# Patient Record
Sex: Female | Born: 1945 | Race: Black or African American | Hispanic: No | Marital: Married | Smoking: Never smoker
Health system: Southern US, Community
[De-identification: ages and names within clinical notes are randomized; demographics above are authoritative.]

## PROBLEM LIST (undated history)

## (undated) DIAGNOSIS — I10 Essential (primary) hypertension: Secondary | ICD-10-CM

## (undated) HISTORY — PX: ABDOMINAL HYSTERECTOMY: SHX81

## (undated) HISTORY — PX: TONSILLECTOMY: SUR1361

## (undated) HISTORY — PX: URETHRAL CYST REMOVAL: SHX5128

## (undated) HISTORY — PX: BREAST SURGERY: SHX581

---

## 2008-08-16 ENCOUNTER — Ambulatory Visit: Payer: Self-pay | Admitting: Family Medicine

## 2008-08-16 DIAGNOSIS — I10 Essential (primary) hypertension: Secondary | ICD-10-CM | POA: Insufficient documentation

## 2010-01-10 ENCOUNTER — Ambulatory Visit: Payer: Self-pay | Admitting: Emergency Medicine

## 2010-01-10 DIAGNOSIS — J069 Acute upper respiratory infection, unspecified: Secondary | ICD-10-CM | POA: Insufficient documentation

## 2010-07-08 NOTE — Assessment & Plan Note (Signed)
Summary: ALLERGIES/KH   Vital Signs:  Patient Profile:   65 Years Old Female CC:      sinus pressure, productive cough, hoarsness x 5 days Height:     64.5 inches Weight:      165 pounds O2 Sat:      100 % O2 treatment:    Room Air Temp:     98.0 degrees F oral Pulse rate:   95 / minute Resp:     14 per minute BP sitting:   133 / 79  (right arm) Cuff size:   regular  Pt. in pain?   no  Vitals Entered By: Lajean Saver RN (January 10, 2010 10:28 AM)                   Current Allergies (reviewed today): ! DARVOCET ! * TYLENOL #3History of Present Illness Chief Complaint: sinus pressure, productive cough, hoarsness x 5 days History of Present Illness: Sinus pressure, cough, hoarseness for 5 days.  She has this about once a year and attributes it to alergies.  It usualy occurs in the spring.  She has been fatigued, facial pain, feeling warm.  No OTCs help.    REVIEW OF SYSTEMS Constitutional Symptoms      Denies fever, chills, night sweats, weight loss, weight gain, and fatigue.  Eyes       Denies change in vision, eye pain, eye discharge, glasses, contact lenses, and eye surgery. Ear/Nose/Throat/Mouth       Complains of sinus problems and hoarseness.      Denies hearing loss/aids, change in hearing, ear pain, ear discharge, dizziness, frequent runny nose, frequent nose bleeds, sore throat, and tooth pain or bleeding.  Respiratory       Complains of wheezing and shortness of breath.      Denies dry cough, productive cough, asthma, bronchitis, and emphysema/COPD.  Cardiovascular       Denies murmurs, chest pain, and tires easily with exhertion.    Gastrointestinal       Denies stomach pain, nausea/vomiting, diarrhea, constipation, blood in bowel movements, and indigestion. Genitourniary       Denies painful urination, kidney stones, and loss of urinary control. Neurological       Denies paralysis, seizures, and fainting/blackouts. Musculoskeletal       Denies muscle  pain, joint pain, joint stiffness, decreased range of motion, redness, swelling, muscle weakness, and gout.  Skin       Denies bruising, unusual mles/lumps or sores, and hair/skin or nail changes.  Psych       Denies mood changes, temper/anger issues, anxiety/stress, speech problems, depression, and sleep problems. Other Comments: symptoms began 5 days ago   Past History:  Past Medical History: Reviewed history from 08/16/2008 and no changes required. HTN dx 2009 Hypertension  Past Surgical History: Reviewed history from 08/16/2008 and no changes required. Rt breast cyst removed age 40 Cystectomy urethra age 9 Hysteroscopy 2002  Family History: Reviewed history from 08/16/2008 and no changes required. Mother deceased Father deceased Brothers age  39,52, 17,55 healthy sisters ages 88 colon ca 3 Breast CA 50 alive and healthy  Social History: Never smoked occ ETOH Drug use-no Drug Use:  no Physical Exam General appearance: well developed, well nourished, no acute distress Ears: normal, no lesions or deformities Nasal: clear discharge Oral/Pharynx: clean PND Neck: neck supple,  trachea midline, no masses Chest/Lungs: no rales, wheezes, or rhonchi bilateral, breath sounds equal without effort Heart: regular rate and  rhythm, no  murmur Skin: no obvious rashes or lesions Assessment New Problems: UPPER RESPIRATORY INFECTION (ICD-465.9)   Patient Education: Patient and/or caregiver instructed in the following: Tylenol prn.  Plan New Medications/Changes: RONDEC-DM 1 tsp by mouth Q6 hours as needed for cough  #150cc x 0, 01/10/2010, Hoyt Koch MD ZITHROMAX Z-PAK 250 MG  TABS (AZITHROMYCIN) Use as directed  #1 x 0, 01/10/2010, Hoyt Koch MD  New Orders: Est. Patient Level II [phone] Planning Comments:   Hydration Take a daily antihistamine (Claritin, etc) Follow-up with your primary care physician if not improving   The patient and/or caregiver  has been counseled thoroughly with regard to medications prescribed including dosage, schedule, interactions, rationale for use, and possible side effects and they verbalize understanding.  Diagnoses and expected course of recovery discussed and will return if not improved as expected or if the condition worsens. Patient and/or caregiver verbalized understanding.  Prescriptions: RONDEC-DM 1 tsp by mouth Q6 hours as needed for cough  #150cc x 0   Entered and Authorized by:   Hoyt Koch MD   Signed by:   Hoyt Koch MD on 01/10/2010   Method used:   Print then Give to Patient   RxID:   [id number] ZITHROMAX Z-PAK 250 MG  TABS (AZITHROMYCIN) Use as directed  #1 x 0   Entered and Authorized by:   Hoyt Koch MD   Signed by:   Hoyt Koch MD on 01/10/2010   Method used:   Print then Give to Patient   RxID:   [id number]   Orders Added: 1)  Est. Patient Level II [29528]

## 2011-05-07 ENCOUNTER — Encounter: Payer: Self-pay | Admitting: *Deleted

## 2011-05-07 ENCOUNTER — Emergency Department (INDEPENDENT_AMBULATORY_CARE_PROVIDER_SITE_OTHER)
Admission: EM | Admit: 2011-05-07 | Discharge: 2011-05-07 | Disposition: A | Discharge status: Home / Self Care | Payer: Medicare Other | Source: Home / Self Care | Attending: Family Medicine | Admitting: Family Medicine

## 2011-05-07 DIAGNOSIS — J069 Acute upper respiratory infection, unspecified: Secondary | ICD-10-CM

## 2011-05-07 MED ORDER — BENZONATATE 200 MG PO CAPS: 200.0000 mg | ORAL_CAPSULE | Freq: Every day | ORAL | Status: AC

## 2011-05-07 MED ORDER — AZITHROMYCIN 250 MG PO TABS: ORAL_TABLET | ORAL | Status: AC

## 2011-05-07 NOTE — ED Notes (Signed)
Pt c/o nasal congestion, drainage and sinus pressure x 1 1/2 wk. She also c/o productive cough. She has taken Alka seltzer cold. Denies fever.

## 2011-05-07 NOTE — ED Provider Notes (Signed)
History     CSN: [id number] Arrival date & time: 05/07/2011 11:25 AM   First MD Initiated Contact with Patient 05/07/11 1144      Chief Complaint  Patient presents with  . Nasal Congestion  . Cough     HPI Comments: Patient complains of approximately 10 day history of gradually progressive URI symptoms beginning with a mild sore throat (now improved), followed by progressive nasal congestion.  A cough started about 43days ago.  Complains of fatigue but no myalgias.  Cough is now worse at night and generally productive during the day.  There has been no pleuritic pain, shortness of breath, or wheezes.   Patient is a 65 y.o. female presenting with cough. The history is provided by the patient.  Cough This is a new problem. The current episode started more than 2 days ago. The problem occurs hourly. The problem has been gradually worsening. The cough is productive of sputum. Associated symptoms include headaches, rhinorrhea, sore throat and wheezing. Pertinent negatives include no chest pain, no chills, no sweats, no weight loss, no ear congestion, no ear pain, no myalgias, no shortness of breath and no eye redness. She has tried decongestants for the symptoms. The treatment provided mild relief. She is not a smoker. Her past medical history does not include pneumonia.    History reviewed. No pertinent past medical history.  Past Surgical History  Procedure Date  . Breast surgery     LT cyst removal age 53  . Tonsillectomy   . Urethral cyst removal     Family History  Problem Relation Age of Onset  . Breast cancer Sister   . Colon cancer Sister     History  Substance Use Topics  . Smoking status: Never Smoker   . Smokeless tobacco: Not on file  . Alcohol Use: Yes    OB History    Grav Para Term Preterm Abortions TAB SAB Ect Mult Living                  Review of Systems  Constitutional: Positive for fatigue. Negative for fever, chills and weight loss.  HENT: Positive  for congestion, sore throat, rhinorrhea and postnasal drip. Negative for ear pain, facial swelling and ear discharge.   Eyes: Negative.  Negative for redness.  Respiratory: Positive for cough and wheezing. Negative for shortness of breath.   Cardiovascular: Negative for chest pain.  Gastrointestinal: Negative.   Genitourinary: Negative.   Musculoskeletal: Negative.  Negative for myalgias.  Neurological: Positive for headaches.    Allergies  Propoxyphene n-acetaminophen; Shellfish allergy; and Tylenol-codeine  Home Medications   Current Outpatient Rx  Name Route Sig Dispense Refill  . AZITHROMYCIN 250 MG PO TABS  Take 2 tabs today; then begin one tab once daily for 4 more days (Rx void after 05/15/11)   DEA # [id number] each 0  . BENZONATATE 200 MG PO CAPS Oral Take 1 capsule (200 mg total) by mouth at bedtime. Take as needed for cough 12 capsule 0    BP 127/84  Pulse 95  Temp(Src) 98.5 F (36.9 C) (Oral)  Resp 18  Ht 5\' 4"  (1.626 m)  Wt 164 lb 8 oz (74.617 kg)  BMI 28.24 kg/m2  SpO2 99%  Physical Exam Nursing notes and Vital Signs reviewed. Appearance:  Patient appears healthy, stated age, and in no acute distress  Eyes:  Pupils are equal, round, and reactive to light and accomodation.  Extraocular movement is intact.  Conjunctivae are not  inflamed  Ears:  Canals normal.  Tympanic membranes normal.  Nose:  Mildly congested turbinates.  No sinus tenderness.   Pharynx:  Normal  Neck:  Supple.  Slightly tender shotty posterior nodes are palpated bilaterally   Lungs:  Clear to auscultation.  Breath sounds are equal.  Heart:  Regular rate and rhythm without murmurs, rubs, or gallops.  Abdomen:  Nontender without masses or hepatosplenomegaly.  Bowel sounds are present.  No CVA or flank tenderness.  Extremities:  No edema or tenderness Skin:  No rash present  ED Course  Procedures None      1. Acute upper respiratory infections of unspecified site       MDM  There  is no evidence of bacterial infection today.  Suspect viral URI Treat symptomatically for now:  Increase fluid intake, begin expectorant/decongestant, topical decongestant, saline nasal spray/saline irrigation, cough suppressant at bedtime. If fever/chills persist, or if not improving 5 days begin amoxicillin (given Rx to hold).  Followup with PCP if not improving 7 to 10 days.        Donna Christen, MD 05/07/11 [phone]

## 2011-05-12 ENCOUNTER — Telehealth: Payer: Self-pay | Admitting: Emergency Medicine

## 2011-07-01 DIAGNOSIS — Z124 Encounter for screening for malignant neoplasm of cervix: Secondary | ICD-10-CM | POA: Diagnosis not present

## 2011-07-01 DIAGNOSIS — Z01419 Encounter for gynecological examination (general) (routine) without abnormal findings: Secondary | ICD-10-CM | POA: Diagnosis not present

## 2011-07-21 DIAGNOSIS — Z803 Family history of malignant neoplasm of breast: Secondary | ICD-10-CM | POA: Diagnosis not present

## 2011-07-21 DIAGNOSIS — Z1231 Encounter for screening mammogram for malignant neoplasm of breast: Secondary | ICD-10-CM | POA: Diagnosis not present

## 2011-07-21 DIAGNOSIS — M949 Disorder of cartilage, unspecified: Secondary | ICD-10-CM | POA: Diagnosis not present

## 2011-07-21 DIAGNOSIS — M899 Disorder of bone, unspecified: Secondary | ICD-10-CM | POA: Diagnosis not present

## 2012-06-21 ENCOUNTER — Emergency Department (INDEPENDENT_AMBULATORY_CARE_PROVIDER_SITE_OTHER)
Admission: EM | Admit: 2012-06-21 | Discharge: 2012-06-21 | Disposition: A | Discharge status: Home / Self Care | Payer: Medicare Other | Source: Home / Self Care | Attending: Family Medicine | Admitting: Family Medicine

## 2012-06-21 ENCOUNTER — Encounter: Payer: Self-pay | Admitting: *Deleted

## 2012-06-21 DIAGNOSIS — J069 Acute upper respiratory infection, unspecified: Secondary | ICD-10-CM

## 2012-06-21 DIAGNOSIS — R5383 Other fatigue: Secondary | ICD-10-CM | POA: Diagnosis not present

## 2012-06-21 DIAGNOSIS — R5381 Other malaise: Secondary | ICD-10-CM | POA: Diagnosis not present

## 2012-06-21 LAB — POCT CBC W AUTO DIFF (K'VILLE URGENT CARE)

## 2012-06-21 MED ORDER — AZITHROMYCIN 250 MG PO TABS: ORAL_TABLET | ORAL | Status: DC

## 2012-06-21 NOTE — ED Notes (Signed)
Pt c/o productive cough, nasal congestion, fatigue, and RT ear ache x 10 days. Denies fever.

## 2012-06-21 NOTE — ED Provider Notes (Signed)
History     CSN: [id number]  Arrival date & time 06/21/12  1436   First MD Initiated Contact with Patient 06/21/12 1449      Chief Complaint  Patient presents with  . Nasal Congestion  . Cough      HPI Comments: Patient complains of onset of fatigue, myalgias, and chills/sweats about 10 days ago.  No other symptoms developed until about 5 or 6 days ago when she developed a cough and sinus congestion.  The cough is generally productive and worse at night.  She remains fatigued,and has noted mild congestion in her right ear.  The history is provided by the patient.    History reviewed. No pertinent past medical history.  Past Surgical History  Procedure Date  . Breast surgery     LT cyst removal age 67  . Tonsillectomy   . Urethral cyst removal   . Abdominal hysterectomy     Family History  Problem Relation Age of Onset  . Breast cancer Sister   . Colon cancer Sister     History  Substance Use Topics  . Smoking status: Never Smoker   . Smokeless tobacco: Not on file  . Alcohol Use: Yes    OB History    Grav Para Term Preterm Abortions TAB SAB Ect Mult Living                  Review of Systems No sore throat + cough No pleuritic pain No wheezing + nasal congestion + post-nasal drainage ? sinus pain/pressure No itchy/red eyes ? earache No hemoptysis No SOB No fever, + chills/sweats No nausea No vomiting No abdominal pain No diarrhea No urinary symptoms No skin rashes + fatigue + myalgias No headache Used OTC meds without relief  Allergies  Acetaminophen-codeine; Propoxyphene-acetaminophen; and Shellfish allergy  Home Medications   Current Outpatient Rx  Name  Route  Sig  Dispense  Refill  . CALCIUM + D PO   Oral   Take by mouth.         . OMEGA-3 FATTY ACIDS 1000 MG PO CAPS   Oral   Take 2 g by mouth daily.         . MULTIPLE VITAMIN PO   Oral   Take by mouth.         . AZITHROMYCIN 250 MG PO TABS      Take 2 tabs today;  then begin one tab once daily for 4 more days. (Rx void after 06/30/12)   6 each   0     BP 167/85  Pulse 88  Temp 98 F (36.7 C) (Oral)  Resp 18  Ht 5\' 4"  (1.626 m)  Wt 163 lb (73.936 kg)  BMI 27.98 kg/m2  SpO2 100%  Physical Exam Nursing notes and Vital Signs reviewed. Appearance:  Patient appears healthy, stated age, and in no acute distress Eyes:  Pupils are equal, round, and reactive to light and accomodation.  Extraocular movement is intact.  Conjunctivae are not inflamed  Ears:  Canals normal.  Tympanic membranes normal.  Nose:  Mildly congested turbinates, moreso on right.  Mild maxillary sinus tenderness is present.  Pharynx:  Normal Neck:  Supple.  No adenopathy Lungs:  Clear to auscultation.  Breath sounds are equal.  Heart:  Regular rate and rhythm without murmurs, rubs, or gallops.  Abdomen:  Nontender without masses or hepatosplenomegaly.  Bowel sounds are present.  No CVA or flank tenderness.  Extremities:  No edema.  No  calf tenderness Skin:  No rash present.   ED Course  Procedures none   Labs Reviewed  POCT CBC W AUTO DIFF ([facility])  WBC 6.9; LY 38.4; MO 5.8; GR 55.8; Hgb 12.4; Platelets 252       1. Fatigue, probably secondary to present illness   2. Acute upper respiratory infections of unspecified site; suspect viral URI       MDM  There is no evidence of bacterial infection today.   Treat symptomatically for now  Take plain Mucinex (guaifenesin) twice daily for cough and congestion.  May add Sudafed for nasal congestion.  Increase fluid intake, rest. May use Afrin nasal spray (or generic oxymetazoline) twice daily for about 5 days.  Also recommend using saline nasal spray several times daily and saline nasal irrigation (AYR is a common brand) May take Delsym Cough Suppressant at bedtime for nighttime cough.  Stop all antihistamines for now, and other non-prescription cough/cold preparations. Begin Azithromycin if not improving about  5 days or if persistent fever develops. Follow-up with family doctor if not improving 7 to 10 days.        Lattie Haw, MD 06/22/12 1102

## 2012-10-05 DIAGNOSIS — Z124 Encounter for screening for malignant neoplasm of cervix: Secondary | ICD-10-CM | POA: Diagnosis not present

## 2012-10-05 DIAGNOSIS — Z1212 Encounter for screening for malignant neoplasm of rectum: Secondary | ICD-10-CM | POA: Diagnosis not present

## 2012-10-05 DIAGNOSIS — Z01419 Encounter for gynecological examination (general) (routine) without abnormal findings: Secondary | ICD-10-CM | POA: Diagnosis not present

## 2012-10-05 DIAGNOSIS — N94819 Vulvodynia, unspecified: Secondary | ICD-10-CM | POA: Diagnosis not present

## 2012-12-07 DIAGNOSIS — H43399 Other vitreous opacities, unspecified eye: Secondary | ICD-10-CM | POA: Diagnosis not present

## 2012-12-07 DIAGNOSIS — H524 Presbyopia: Secondary | ICD-10-CM | POA: Diagnosis not present

## 2012-12-07 DIAGNOSIS — H40009 Preglaucoma, unspecified, unspecified eye: Secondary | ICD-10-CM | POA: Diagnosis not present

## 2013-06-07 DIAGNOSIS — N9089 Other specified noninflammatory disorders of vulva and perineum: Secondary | ICD-10-CM | POA: Diagnosis not present

## 2014-03-08 DIAGNOSIS — Z01419 Encounter for gynecological examination (general) (routine) without abnormal findings: Secondary | ICD-10-CM | POA: Diagnosis not present

## 2014-03-08 DIAGNOSIS — N814 Uterovaginal prolapse, unspecified: Secondary | ICD-10-CM | POA: Diagnosis not present

## 2014-09-20 DIAGNOSIS — H25011 Cortical age-related cataract, right eye: Secondary | ICD-10-CM | POA: Diagnosis not present

## 2014-09-20 DIAGNOSIS — H40003 Preglaucoma, unspecified, bilateral: Secondary | ICD-10-CM | POA: Diagnosis not present

## 2015-05-23 DIAGNOSIS — Z01419 Encounter for gynecological examination (general) (routine) without abnormal findings: Secondary | ICD-10-CM | POA: Diagnosis not present

## 2015-10-30 DIAGNOSIS — B079 Viral wart, unspecified: Secondary | ICD-10-CM | POA: Diagnosis not present

## 2015-10-30 DIAGNOSIS — D485 Neoplasm of uncertain behavior of skin: Secondary | ICD-10-CM | POA: Diagnosis not present

## 2015-10-30 DIAGNOSIS — L821 Other seborrheic keratosis: Secondary | ICD-10-CM | POA: Diagnosis not present

## 2015-11-20 DIAGNOSIS — B079 Viral wart, unspecified: Secondary | ICD-10-CM | POA: Diagnosis not present

## 2016-06-04 ENCOUNTER — Emergency Department (INDEPENDENT_AMBULATORY_CARE_PROVIDER_SITE_OTHER)
Admission: EM | Admit: 2016-06-04 | Discharge: 2016-06-04 | Disposition: A | Discharge status: Home / Self Care | Payer: Medicare Other | Source: Home / Self Care | Attending: Family Medicine | Admitting: Family Medicine

## 2016-06-04 ENCOUNTER — Encounter: Payer: Self-pay | Admitting: *Deleted

## 2016-06-04 ENCOUNTER — Emergency Department (INDEPENDENT_AMBULATORY_CARE_PROVIDER_SITE_OTHER): Payer: Medicare Other

## 2016-06-04 DIAGNOSIS — M779 Enthesopathy, unspecified: Secondary | ICD-10-CM | POA: Diagnosis not present

## 2016-06-04 DIAGNOSIS — M25542 Pain in joints of left hand: Secondary | ICD-10-CM | POA: Diagnosis not present

## 2016-06-04 DIAGNOSIS — M79642 Pain in left hand: Secondary | ICD-10-CM | POA: Diagnosis not present

## 2016-06-04 DIAGNOSIS — M778 Other enthesopathies, not elsewhere classified: Secondary | ICD-10-CM

## 2016-06-04 HISTORY — DX: Essential (primary) hypertension: I10

## 2016-06-04 MED ORDER — DICLOFENAC SODIUM 1 % TD GEL: 1.0000 "application " | Freq: Three times a day (TID) | TRANSDERMAL | 0 refills | Status: DC

## 2016-06-04 NOTE — ED Provider Notes (Signed)
[name]    CSN: [id number] Arrival date & time: 06/04/16  1113     History   Chief Complaint Chief Complaint  Patient presents with  . Hand Pain    HPI Darlene Strickland is a 70 y.o. female.   Patient fell down stairs about one month ago, injuring her left hand.  She complains of persistent soreness and stiffness in her left hand each evening after work.   The history is provided by the patient.  Hand Pain  This is a new problem. Episode onset: 1 month ago. The problem occurs constantly. The problem has not changed since onset.Exacerbated by: flexion and extension of fingers. Nothing relieves the symptoms. She has tried nothing for the symptoms.    Past Medical History:  Diagnosis Date  . Hypertension     Patient Active Problem List   Diagnosis Date Noted  . UPPER RESPIRATORY INFECTION 01/10/2010  . HYPERTENSION 08/16/2008    Past Surgical History:  Procedure Laterality Date  . ABDOMINAL HYSTERECTOMY    . BREAST SURGERY     LT cyst removal age 58  . TONSILLECTOMY    . URETHRAL CYST REMOVAL      OB History    No data available       Home Medications    Prior to Admission medications   Medication Sig Start Date End Date Taking? Authorizing Provider  Calcium Carbonate-Vitamin D (CALCIUM + D PO) Take by mouth.    Historical Provider, MD  diclofenac sodium (VOLTAREN) 1 % GEL Apply 1 application topically 3 (three) times daily. Apply 1 to 2 gm each application [id number]   Kandra Nicolas, MD  fish oil-omega-3 fatty acids 1000 MG capsule Take 2 g by mouth daily.    Historical Provider, MD  MULTIPLE VITAMIN PO Take by mouth.    Historical Provider, MD    Family History Family History  Problem Relation Age of Onset  . Breast cancer Sister   . Colon cancer Sister     Social History Social History  Substance Use Topics  . Smoking status: Never Smoker  . Smokeless tobacco: Never Used  . Alcohol use Yes     Allergies     Acetaminophen-codeine; Darvon [propoxyphene]; Propoxyphene n-acetaminophen; and Shellfish allergy   Review of Systems Review of Systems  All other systems reviewed and are negative.    Physical Exam Triage Vital Signs ED Triage Vitals  Enc Vitals Group     BP 06/04/16 1138 168/92     Pulse Rate 06/04/16 1138 77     Resp 06/04/16 1138 18     Temp 06/04/16 1138 97.5 F (36.4 C)     Temp Source 06/04/16 1138 Oral     SpO2 06/04/16 1138 100 %     Weight 06/04/16 1139 166 lb (75.3 kg)     Height 06/04/16 1139 5\' 4"  (1.626 m)     Head Circumference --      Peak Flow --      Pain Score 06/04/16 1140 6     Pain Loc --      Pain Edu? --      Excl. in [place]? --    No data found.   Updated Vital Signs BP 168/92 (BP Location: Left Arm)   Pulse 77   Temp 97.5 F (36.4 C) (Oral)   Resp 18   Ht 5\' 4"  (1.626 m)   Wt 166 lb (75.3 kg)   SpO2 100%   BMI 28.49 kg/m  Visual Acuity Right Eye Distance:   Left Eye Distance:   Bilateral Distance:    Right Eye Near:   Left Eye Near:    Bilateral Near:     Physical Exam  Constitutional: She appears well-developed and well-nourished. No distress.  HENT:  Head: Normocephalic.  Right Ear: External ear normal.  Left Ear: External ear normal.  Nose: Nose normal.  Mouth/Throat: Oropharynx is clear and moist.  Eyes: Conjunctivae are normal. Pupils are equal, round, and reactive to light.  Neck: Normal range of motion.  Cardiovascular: Normal heart sounds.   Pulmonary/Chest: Breath sounds normal.  Musculoskeletal:       Left hand: She exhibits decreased range of motion and tenderness. She exhibits no bony tenderness.       Hands: Left hand reveals distinct tenderness to palpation over the palmar surfaces of the 2nd, 3rd, and 4th MCP and PIP joints.  Pain is elicited by restricted flexion of these joints.  Distal neurovascular function is intact.   Neurological: She is alert.  Skin: Skin is warm and dry.  Nursing note and vitals  reviewed.    UC Treatments / Results  Labs (all labs ordered are listed, but only abnormal results are displayed) Labs Reviewed - No data to display  EKG  EKG Interpretation None       Radiology Dg Hand Complete Left  Result Date: 06/04/2016 CLINICAL DATA:  Left hand pain across the first and third metacarpals after fall 1 month ago. Initial encounter. EXAM: LEFT HAND - COMPLETE 3+ VIEW COMPARISON:  None. FINDINGS: There is no evidence of fracture or dislocation. No opaque foreign body. Osteopenia and mild degenerative productive change around the MCP and interphalangeal joints. IMPRESSION: No acute or posttraumatic finding. Electronically Signed   By: Monte Fantasia M.D.   On: 06/04/2016 11:46    Procedures Procedures (including critical care time)  Medications Ordered in UC Medications - No data to display   Initial Impression / Assessment and Plan / UC Course  I have reviewed the triage vital signs and the nursing notes.  Pertinent labs & imaging results that were available during my care of the patient were reviewed by me and considered in my medical decision making (see chart for details).  Clinical Course   Suspect tendonitis of finger flexor tendons. Begin Voltaren gel. Followup with Dr. Aundria Mems or Dr. Lynne Leader ([facility]) for further evaluation/treatment.     Final Clinical Impressions(s) / UC Diagnoses   Final diagnoses:  Tendonitis of finger    New Prescriptions New Prescriptions   DICLOFENAC SODIUM (VOLTAREN) 1 % GEL    Apply 1 application topically 3 (three) times daily. Apply 1 to 2 gm each application     Kandra Nicolas, MD 06/21/16 1640

## 2016-06-04 NOTE — ED Triage Notes (Signed)
Pt c/o LT hand pain x 1 mth post fall.

## 2016-06-09 ENCOUNTER — Ambulatory Visit (INDEPENDENT_AMBULATORY_CARE_PROVIDER_SITE_OTHER): Payer: Medicare Other

## 2016-06-09 ENCOUNTER — Encounter: Payer: Self-pay | Admitting: Sports Medicine

## 2016-06-09 ENCOUNTER — Ambulatory Visit (INDEPENDENT_AMBULATORY_CARE_PROVIDER_SITE_OTHER): Payer: Medicare Other | Admitting: Sports Medicine

## 2016-06-09 DIAGNOSIS — M25561 Pain in right knee: Secondary | ICD-10-CM | POA: Diagnosis not present

## 2016-06-09 DIAGNOSIS — I10 Essential (primary) hypertension: Secondary | ICD-10-CM

## 2016-06-09 DIAGNOSIS — M170 Bilateral primary osteoarthritis of knee: Secondary | ICD-10-CM

## 2016-06-09 DIAGNOSIS — M25562 Pain in left knee: Secondary | ICD-10-CM | POA: Diagnosis not present

## 2016-06-09 DIAGNOSIS — M19042 Primary osteoarthritis, left hand: Secondary | ICD-10-CM

## 2016-06-09 MED ORDER — MELOXICAM 15 MG PO TABS: ORAL_TABLET | ORAL | 3 refills | Status: DC

## 2016-06-09 NOTE — Assessment & Plan Note (Signed)
Pain locally and focused at the third MCP after a fall. No fractures, this is likely just osteoarthritis. She is fairly swollen here. Meloxicam for one month, hand therapy, injection if no better.

## 2016-06-09 NOTE — Assessment & Plan Note (Signed)
Meloxicam and physical therapy for the hands and the knees. The x-rays.  Return in one month, we will consider viscosupplementation if no better.

## 2016-06-09 NOTE — Progress Notes (Signed)
   Subjective:    I'm seeing this patient as a consultation for:  Dr. Theone Murdoch  CC: Left hand pain  HPI: This is a very pleasant and athletic 71 year old female, over things giving she tripped and fell onto her left hand, she had immediate pain, swelling, and a bit of bruising at the third MCP. She continued to use her hand, and presented to urgent care last week. X-rays were negative with the exception of widespread osteoarthritis, and she was referred to me for further evaluation and definitive treatment, pain is persistent, localized without radiation.  In addition she complains of bilateral knee pain with gelling, anterior, worse with going up and down stairs, moderate, persistent without radiation, no mechanical symptoms.  Past medical history:  Negative.  See flowsheet/record as well for more information.  Surgical history: Negative.  See flowsheet/record as well for more information.  Family history: Negative.  See flowsheet/record as well for more information.  Social history: Negative.  See flowsheet/record as well for more information.  Allergies, and medications have been entered into the medical record, reviewed, and no changes needed.   Review of Systems: No headache, visual changes, nausea, vomiting, diarrhea, constipation, dizziness, abdominal pain, skin rash, fevers, chills, night sweats, weight loss, swollen lymph nodes, body aches, joint swelling, muscle aches, chest pain, shortness of breath, mood changes, visual or auditory hallucinations.   Objective:   General: Well Developed, well nourished, and in no acute distress.  Neuro/Psych: Alert and oriented x3, extra-ocular muscles intact, able to move all 4 extremities, sensation grossly intact. Skin: Warm and dry, no rashes noted.  Respiratory: Not using accessory muscles, speaking in full sentences, trachea midline.  Cardiovascular: Pulses palpable, no extremity edema. Abdomen: Does not appear distended. Left hand:  Visibly swollen over the third MCP, no bruising, somewhat stiff, with loss of range of motion in flexion at the MCP.  Impression and Recommendations:   This case required medical decision making of moderate complexity.  Primary osteoarthritis of both knees Meloxicam and physical therapy for the hands and the knees. The x-rays.  Return in one month, we will consider viscosupplementation if no better.  Osteoarthritis of hand, primary localized, left Pain locally and focused at the third MCP after a fall. No fractures, this is likely just osteoarthritis. She is fairly swollen here. Meloxicam for one month, hand therapy, injection if no better.

## 2016-06-17 ENCOUNTER — Ambulatory Visit (INDEPENDENT_AMBULATORY_CARE_PROVIDER_SITE_OTHER): Payer: Medicare Other | Admitting: Rehabilitative and Restorative Service Providers"

## 2016-06-17 ENCOUNTER — Encounter: Payer: Self-pay | Admitting: Rehabilitative and Restorative Service Providers"

## 2016-06-17 DIAGNOSIS — M79642 Pain in left hand: Secondary | ICD-10-CM | POA: Diagnosis not present

## 2016-06-17 DIAGNOSIS — R29898 Other symptoms and signs involving the musculoskeletal system: Secondary | ICD-10-CM

## 2016-06-17 NOTE — Therapy (Signed)
[facility] [place], [place], [address] Phone: [phone]   Fax:  [phone]  Physical Therapy Evaluation  Patient Details  Name: Darlene Strickland MRN: [id number] Date of Birth: 01/21/1946 Referring Provider: Dr Dianah Field  Encounter Date: 06/17/2016      PT End of Session - 06/17/16 1137    Visit Number 1   Number of Visits 6   Date for PT Re-Evaluation 07/29/16   PT Start Time 1016   PT Stop Time 1109   PT Time Calculation (min) 53 min   Activity Tolerance Patient tolerated treatment well      Past Medical History:  Diagnosis Date  . Hypertension     Past Surgical History:  Procedure Laterality Date  . ABDOMINAL HYSTERECTOMY    . BREAST SURGERY     LT cyst removal age 71  . TONSILLECTOMY    . URETHRAL CYST REMOVAL      There were no vitals filed for this visit.       Subjective Assessment - 06/17/16 1029    Subjective Patient reports thta she fell 11/17 when going down the stairs. She fell landing on the Lt hand. she noticed soreness in the hand which has continued. She was seen by MD and diagnosed with osteoarthritic changes. Patient reports pain with functional activities and any use of Lt hand and stiffness in the hand in the morning when she first awakens.    Pertinent History arthris; bilat CTS - received injections and had PT for CTS with good resolution no problems in past 10 years    Diagnostic tests xrays - osteoarthritis    Patient Stated Goals get hand feeling better- use hand normally   Currently in Pain? Yes   Pain Score 6    Pain Location Hand   Pain Orientation Left   Pain Descriptors / Indicators Burning;Aching   Pain Type Acute pain   Pain Onset More than a month ago   Pain Frequency Intermittent   Aggravating Factors  moving/using hand; touching hand in areas of discomfort    Pain Relieving Factors OTC meds; analgesic cream; mosit heat             [facility] PT  Assessment - 06/17/16 0001      Assessment   Medical Diagnosis Lt hand pain    Referring Provider Dr Dianah Field   Onset Date/Surgical Date 05/03/16   Hand Dominance Right   Next MD Visit 2/18   Prior Therapy none for hand      Precautions   Precautions None     Balance Screen   Has the patient fallen in the past 6 months Yes   How many times? 1   Has the patient had a decrease in activity level because of a fear of falling?  No   Is the patient reluctant to leave their home because of a fear of falling?  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    Leisure household chores/activities - active      Observation/Other Assessments   Focus on Therapeutic Outcomes (FOTO)  50% limitation      Sensation   Additional Comments intermittent tingling in tips of  fingers      Posture/Postural Control   Posture Comments some head forward posture; shoulders rounded and elevated; increased thoracic kyphosis     AROM   Right/Left Shoulder --  WFL's and equal bilat    Right/Left Elbow --  WFL's and equal bilat    Right/Left Forearm --  WFL's and equal bilat    Right/Left Wrist --  WFL's and equal bilat    Right/Left Finger --  WFL's MP/PIP/DIP - tight composite Lt long finger      Strength   Overall Strength Comments WFL's with manual muscle testing bilat UE's patient has some pain with resisted isoloated Lt long finger extension      Palpation   Palpation comment tenderness to palpation Lt extensor forearm compared to Rt                    [facility] Adult PT Treatment/Exercise - 06/17/16 0001      Therapeutic Activites    Therapeutic Activities --  discussed self massage through the Lt extensor forearm      Hand  Exercises   MCPJ Flexion AAROM;Left  long finger 10-15 sec hold x 3 reps    Other Hand Exercises extensor forearm stretch elbow straight; wrist flexed; light fist - assisting with Rt UE for stretch 10-15 sec hold x 3   Other Hand Exercises nerve/facial stretch pt supine head rotated and flexed to Rt; Lt shd at 90 deg abd; elbow extended; wrist flexed; palm toward floor; light fist 1 min hold x 2      Electrical Stimulation   Electrical Stimulation Location Lt long finger and wrist dorsal surfaces; Lt extensor forearm    Electrical Stimulation Action IFC   Electrical Stimulation Parameters to tolerance   Electrical Stimulation Goals Pain;Tone     Manual Therapy   Manual therapy comments pt sitting Lt UE supported    Soft tissue mobilization working through the Lt long finger and wrist dorsally into the Lt extensor forearm   Passive ROM Lt long finger stretcing multiple joints into composit fist with wrist flexion to pt tolerance 10-15 sec hold x 3 reps                 PT Education - 06/17/16 1111    Education provided Yes   Education Details HEP; TENS    Person(s) Educated Patient   Methods Explanation;Demonstration;Tactile cues;Verbal cues;Handout   Comprehension Verbalized understanding;Returned demonstration;Verbal cues required;Tactile cues required             PT Long Term Goals - 06/17/16 1142      PT LONG TERM GOAL #1   Title Patient to demonstrate full painfree composit fisting of Lt fingers 07/29/16   Time 6   Period Weeks   Status New     PT LONG TERM GOAL #2   Title Patient to report return to normal functional activities including household chores without pain or limitations 07/29/16   Time 6   Period Weeks   Status New     PT LONG TERM GOAL #3   Title Independent in HEP 07/29/16   Time 6   Period Weeks   Status New     PT LONG TERM GOAL #4   Title Improve FOTO to </= 34% limitation 07/29/16   Time 6   Period Weeks   Status New                Plan - 06/17/16 1138  Clinical Impression Statement Patient presents with Lt hand and speciffically Lt long finger pain following a fall 05/03/16. She has pain with resistive Lt long finger extension; tightness to palpation through Lt extensor forearm; pain and limitations with functional activities using Lt hand. Patient has irritation through Lt long finger extensors.    Rehab Potential Good   PT Frequency 1x / week   PT Duration 6 weeks   PT Treatment/Interventions Patient/family education;ADLs/Self Care Home Management;Cryotherapy;Electrical Stimulation;Iontophoresis 4mg /ml Dexamethasone;Moist Heat;Ultrasound;Dry needling;Manual techniques;Therapeutic activities;Therapeutic exercise   PT Next Visit Plan assess grip strength; continue stretching Lt wrist and finger extensors, speciffically Lt long finger; manual work through extensors; modalities as indicated; consider ionto as indicated    Consulted and Agree with Plan of Care Patient      Patient will benefit from skilled therapeutic intervention in order to improve the following deficits and impairments:  Pain, Impaired UE functional use, Increased fascial restricitons, Increased muscle spasms, Decreased activity tolerance  Visit Diagnosis: Pain of left hand - Plan: PT plan of care cert/re-cert  Other symptoms and signs involving the musculoskeletal system - Plan: PT plan of care cert/re-cert     Problem List Patient Active Problem List   Diagnosis Date Noted  . Osteoarthritis of hand, primary localized, left 06/09/2016  . Primary osteoarthritis of both knees 06/09/2016  . Essential hypertension 08/16/2008    Alto Gandolfo Nilda Simmer PT, MPH  06/17/2016, 11:47 AM  [facility] [place], [place], [address] Phone: [phone]   Fax:  [phone]  Name: Danyale Mayall MRN: [id number] Date of Birth: 08/03/1945

## 2016-06-17 NOTE — Patient Instructions (Addendum)
Wrist Extensor Stretch    Keeping elbow straight, light fist, grasp left hand and slowly bend wrist forward until stretch is felt. Hold __20__ seconds. Relax. Repeat __3__ times per set. Do __3-4 __ sessions per day.    Neurovascular:Nerve Stretch - Supine    Lie with neck supported, side-bent away from moving arm. Hold right arm out to side, elbow bent, palm facing the floor. Hold for _60___ seconds. Repeat _2_ times. Do __2 times/day  PROM: Finger MP Joints    Passively bend __long______ finger(s) of left hand at first row of knuckles until stretch is felt, then bend the next joint and the next joint.  Hold _15-20___ seconds. Relax.  Repeat __3-5__ times per set.  Do __2-3__ sessions per day.  TENS UNIT: This is helpful for muscle pain and spasm.   Search and Purchase a TENS 7000 2nd edition at www.tenspros.com. It should be less than $30.     TENS unit instructions: Do not shower or bathe with the unit on Turn the unit off before removing electrodes or batteries If the electrodes lose stickiness add a drop of water to the electrodes after they are disconnected from the unit and place on plastic sheet. If you continued to have difficulty, call the TENS unit company to purchase more electrodes. Do not apply lotion on the skin area prior to use. Make sure the skin is clean and dry as this will help prolong the life of the electrodes. After use, always check skin for unusual red areas, rash or other skin difficulties. If there are any skin problems, does not apply electrodes to the same area. Never remove the electrodes from the unit by pulling the wires. Do not use the TENS unit or electrodes other than as directed. Do not change electrode placement without consultating your therapist or physician. Keep 2 fingers with between each electrode.

## 2016-06-23 ENCOUNTER — Ambulatory Visit (INDEPENDENT_AMBULATORY_CARE_PROVIDER_SITE_OTHER): Payer: Medicare Other | Admitting: Physical Therapy

## 2016-06-23 DIAGNOSIS — R29898 Other symptoms and signs involving the musculoskeletal system: Secondary | ICD-10-CM | POA: Diagnosis not present

## 2016-06-23 DIAGNOSIS — M79642 Pain in left hand: Secondary | ICD-10-CM | POA: Diagnosis not present

## 2016-06-23 NOTE — Therapy (Signed)
[place], [place], [address] Phone: [phone]   Fax:  [phone]  Physical Therapy Treatment  Patient Details  Name: Darlene Strickland MRN: [id number] Date of Birth: September 26, 1945 Referring Provider: Dr. Dianah Field   Encounter Date: 06/23/2016      PT End of Session - 06/23/16 0936    Visit Number 2   Number of Visits 6   Date for PT Re-Evaluation 07/29/16   PT Start Time 0934   PT Stop Time 1021   PT Time Calculation (min) 47 min   Activity Tolerance Patient tolerated treatment well;No increased pain      Past Medical History:  Diagnosis Date  . Hypertension     Past Surgical History:  Procedure Laterality Date  . ABDOMINAL HYSTERECTOMY    . BREAST SURGERY     LT cyst removal age 18  . TONSILLECTOMY    . URETHRAL CYST REMOVAL      There were no vitals filed for this visit.      Subjective Assessment - 06/23/16 0936    Subjective Mornings seem to hurt the most (6/10); Lt hand is very stiff.  Pain improves as she uses hand.     Currently in Pain? No/denies   Pain Score 0-No pain            [facility] PT Assessment - 06/23/16 0001      Assessment   Medical Diagnosis Lt hand pain    Referring Provider Dr. Dianah Field    Onset Date/Surgical Date 05/03/16   Hand Dominance Right   Next MD Visit 07/07/16     ROM / Strength   AROM / PROM / Strength Strength     Strength   Strength Assessment Site Hand   Right/Left hand Right;Left   Right Hand Grip (lbs) 68   Left Hand Grip (lbs) 36          [facility] Adult PT Treatment/Exercise - 06/23/16 0001      Self-Care   Self-Care Other Self-Care Comments   Other Self-Care Comments  Pt instructed in application and use of TENS unit.   Pt verbalized understanding      Exercises   Exercises Wrist     Hand Exercises   MCPJ Flexion Left;Self ROM  long finger 10-15 sec hold x 3 reps    Other Hand Exercises bilat wrist flex/ ext  stretch x 30 sec x 3 reps each direction.  Lt hand squeeze and finger ext (handmaster, red) x 10 reps x 2 sets   Other Hand Exercises nerve/facial stretch pt supine head rotated and flexed to Rt; Lt shd at 90 deg abd; elbow extended; wrist flexed; palm toward floor; light fist 1 min hold x 2      Moist Heat Therapy   Number Minutes Moist Heat 10 Minutes   Moist Heat Location Wrist;Hand     Electrical Stimulation   Electrical Stimulation Location Lt 2nd finger at MP and lt wrist ext    Electrical Stimulation Action premod    Electrical Stimulation Parameters to tolerance   Electrical Stimulation Goals Pain     Manual Therapy   Manual therapy comments pt sitting Lt UE supported    Soft tissue mobilization Pin and stretch Lt wrist flex/ extensors, muscle stripping to Lt wrsit extensors and lt hand interossi.                 PT Education - 06/23/16 1044    Education provided Yes  Education Details TENS application; use of stress ball to squeeze to improve grip strength.  (pt issued ball)   Person(s) Educated Patient   Methods Explanation;Demonstration   Comprehension Verbalized understanding             PT Long Term Goals - 06/23/16 1041      PT LONG TERM GOAL #1   Title Patient to demonstrate full painfree composit fisting of Lt fingers 07/29/16   Time 6   Period Weeks   Status On-going     PT LONG TERM GOAL #2   Title Patient to report return to normal functional activities including household chores without pain or limitations 07/29/16   Time 6   Period Weeks   Status On-going     PT LONG TERM GOAL #3   Title Independent in HEP 07/29/16   Time 6   Period Weeks   Status On-going     PT LONG TERM GOAL #4   Title Improve FOTO to </= 34% limitation 07/29/16   Time 6   Period Weeks   Status On-going               Plan - 06/23/16 1018    Clinical Impression Statement Pt demonstrated decreased Lt grip strength compared to Rt hand.  She tolerated all  exercises well, without increase in pain. She noted decreased tightness after manual therapy and estim/MHP. Pt progressing towards goals.    Rehab Potential Good   PT Frequency 1x / week   PT Duration 6 weeks   PT Treatment/Interventions Patient/family education;ADLs/Self Care Home Management;Cryotherapy;Electrical Stimulation;Iontophoresis 4mg /ml Dexamethasone;Moist Heat;Ultrasound;Dry needling;Manual techniques;Therapeutic activities;Therapeutic exercise   PT Next Visit Plan continue stretching Lt wrist and finger extensors, specifically Lt long finger; manual work through extensors; modalities as indicated.   Consulted and Agree with Plan of Care Patient      Patient will benefit from skilled therapeutic intervention in order to improve the following deficits and impairments:  Pain, Impaired UE functional use, Increased fascial restricitons, Increased muscle spasms, Decreased activity tolerance  Visit Diagnosis: Pain of left hand  Other symptoms and signs involving the musculoskeletal system     Problem List Patient Active Problem List   Diagnosis Date Noted  . Osteoarthritis of hand, primary localized, left 06/09/2016  . Primary osteoarthritis of both knees 06/09/2016  . Essential hypertension 08/16/2008   Kerin Perna, PTA 06/23/16 10:44 AM  [facility] [place], [place], [address] Phone: [phone]   Fax:  [phone]  Name: Darlene Strickland MRN: [id number] Date of Birth: 1946-04-15

## 2016-06-30 ENCOUNTER — Ambulatory Visit (INDEPENDENT_AMBULATORY_CARE_PROVIDER_SITE_OTHER): Payer: Medicare Other | Admitting: Physical Therapy

## 2016-06-30 DIAGNOSIS — R29898 Other symptoms and signs involving the musculoskeletal system: Secondary | ICD-10-CM

## 2016-06-30 DIAGNOSIS — M79642 Pain in left hand: Secondary | ICD-10-CM

## 2016-06-30 NOTE — Therapy (Addendum)
[place], [place], [address] Phone: [phone]   Fax:  [phone]  Physical Therapy Treatment  Patient Details  Name: Darlene Strickland MRN: [id number] Date of Birth: 1945/08/24 Referring Provider: Dr. Dianah Field   Encounter Date: 06/30/2016      PT End of Session - 06/30/16 0933    Visit Number 3   Number of Visits 6   Date for PT Re-Evaluation 07/29/16   PT Start Time 0933   PT Stop Time 1006   PT Time Calculation (min) 33 min   Activity Tolerance Patient tolerated treatment well   Behavior During Therapy [facility] for tasks assessed/performed      Past Medical History:  Diagnosis Date  . Hypertension     Past Surgical History:  Procedure Laterality Date  . ABDOMINAL HYSTERECTOMY    . BREAST SURGERY     LT cyst removal age 55  . TONSILLECTOMY    . URETHRAL CYST REMOVAL      There were no vitals filed for this visit.      Subjective Assessment - 06/30/16 0934    Subjective "I have good days, and other days it (Lt hand) feels very stiff"  She complains of pain up to 8/10, only when squeezing fist.  She has been using stress star that was issued and has been stretching.  She feels 40% improvement in symptoms since initiating therapy.    Patient Stated Goals get hand feeling better- use hand normally   Currently in Pain? No/denies            [facility] PT Assessment - 06/30/16 0001      Strength   Strength Assessment Site Hand   Right/Left hand Right;Left   Right Hand Grip (lbs) 70   Left Hand Grip (lbs) 36           [facility] Adult PT Treatment/Exercise - 06/30/16 0001      Hand Exercises   MCPJ Flexion Left;Self ROM  long finger 10-15 sec hold x 3 reps    Digiticizer yellow x 20 reps    Other Hand Exercises bilat wrist flex/ ext stretch x 30 sec x 3 reps each direction.  Lt hand squeeze and finger ext (handmaster, red) x 10 reps x 2 sets     Wrist Exercises   Other wrist  exercises wrist ext stretch x 30 sec x 3 reps.      Modalities   Modalities Ultrasound     Ultrasound   Ultrasound Location Lt 2nd metacarpal / phalange (palmar/dorsal surface)   Ultrasound Parameters 100%, 3.3 mHz, 1.0 w/cm2 x 8 min    Ultrasound Goals Pain;Other (Comment)  tightness     Manual Therapy   Manual therapy comments pt sitting Lt UE supported    Soft tissue mobilization  muscle stripping to Lt wrsit extensors and lt hand interossi.    Passive ROM gentle motion through rays, flexion/ext through 1st and 2nd fingers.            PT Long Term Goals - 06/30/16 1016      PT LONG TERM GOAL #1   Title Patient to demonstrate full painfree composit fisting of Lt fingers 07/29/16   Time 6   Period Weeks   Status On-going     PT LONG TERM GOAL #2   Title Patient to report return to normal functional activities including household chores without pain or limitations 07/29/16   Time 6   Period Weeks  Status On-going  improving     PT LONG TERM GOAL #3   Title Independent in HEP 07/29/16   Time 6   Period Weeks   Status On-going     PT LONG TERM GOAL #4   Title Improve FOTO to </= 34% limitation 07/29/16   Time 6   Period Weeks   Status On-going               Plan - 06/30/16 1019    Clinical Impression Statement Pt reporting improvement in symptoms; having more "good" days than bad. Grip strength unchanged since last visit.  She reported some mild increase in pain in 2nd finger/ MCP joint with flexion/ grip exercises today; reduced with rest and use of [place].  Pt reported her hand felt "looser" at end of session.  Pt is progressing towards goals.    Rehab Potential Good   PT Frequency 1x / week   PT Duration 6 weeks   PT Treatment/Interventions Patient/family education;ADLs/Self Care Home Management;Cryotherapy;Electrical Stimulation;Iontophoresis 32m/ml Dexamethasone;Moist Heat;Ultrasound;Dry needling;Manual techniques;Therapeutic activities;Therapeutic exercise    PT Next Visit Plan Continue stretching/strengthening in Lt wrist/fingers.  Manual therapy and modalities as indicated.     PT Home Exercise Plan Pt issued round stress ball for work on grip strength.     Consulted and Agree with Plan of Care Patient      Patient will benefit from skilled therapeutic intervention in order to improve the following deficits and impairments:  Pain, Impaired UE functional use, Increased fascial restricitons, Increased muscle spasms, Decreased activity tolerance  Visit Diagnosis: Pain of left hand  Other symptoms and signs involving the musculoskeletal system     Problem List Patient Active Problem List   Diagnosis Date Noted  . Osteoarthritis of hand, primary localized, left 06/09/2016  . Primary osteoarthritis of both knees 06/09/2016  . Essential hypertension 08/16/2008   JKerin Perna PTA 06/30/16 10:24 AM  [place] [address]Phone: [phone]  Fax:  [phone] Name: Darlene DolliverMRN: [id number] of Birth: 1May 22, 1947 PHYSICAL THERAPY DISCHARGE SUMMARY  Visits from Start of Care: 3  Current functional level related to goals / functional outcomes: See progress note for discharge progress/status.   Remaining deficits: See progress note   Education / Equipment: HEP Plan: Patient agrees to discharge.  Patient goals were partially met. Patient is being discharged due to financial reasons.  ?????  Concerned with the high co-pay for therapy.   Celyn P. HHelene KelpPT, MPH 07/29/16 2:47 PM

## 2016-07-07 ENCOUNTER — Ambulatory Visit: Payer: Medicare Other | Admitting: Sports Medicine

## 2016-07-10 ENCOUNTER — Encounter: Payer: Medicare Other | Admitting: Physical Therapy

## 2016-10-01 DIAGNOSIS — Z01419 Encounter for gynecological examination (general) (routine) without abnormal findings: Secondary | ICD-10-CM | POA: Diagnosis not present

## 2017-05-20 IMAGING — DX DG KNEE COMPLETE 4+V*R*
5 series · 5 of 5 positions shown · non-contrast
Comparison: None.

CLINICAL DATA: Pt having bilateral knee pain for years and getting
worse with no injury

EXAM:
RIGHT KNEE - COMPLETE 4+ VIEW

[knee lat (1 of 2)]
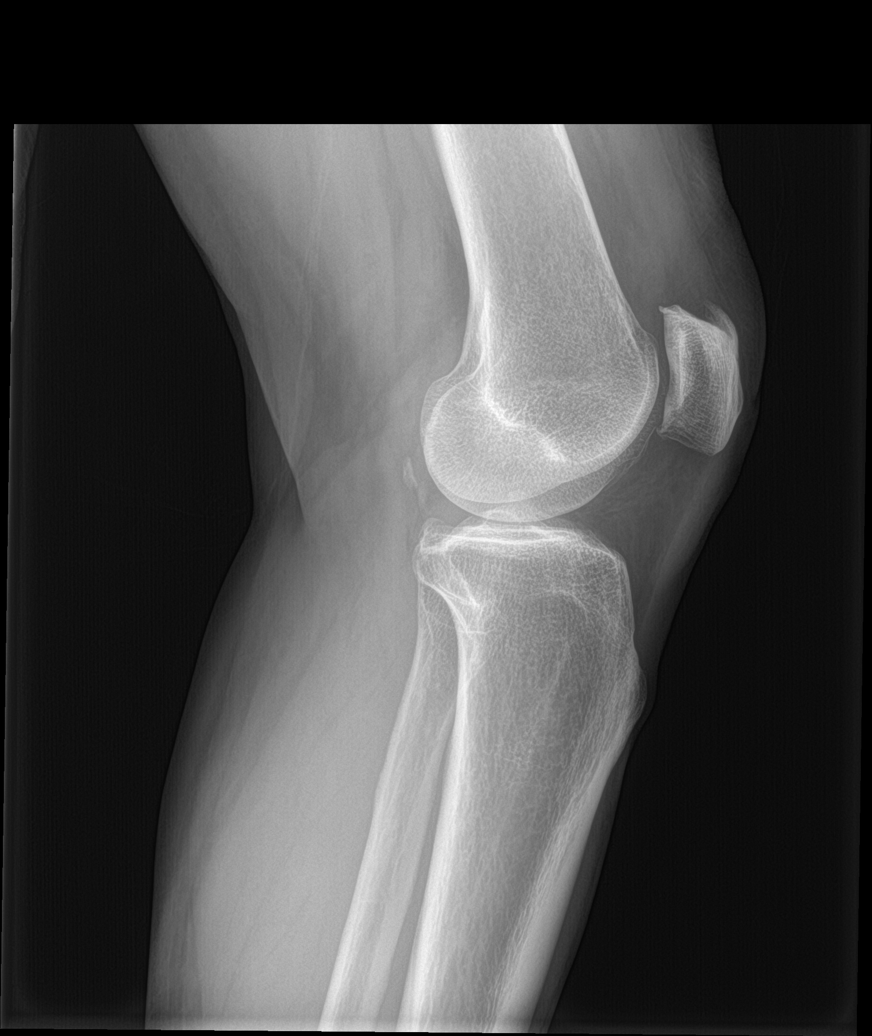

[knee sunrise]
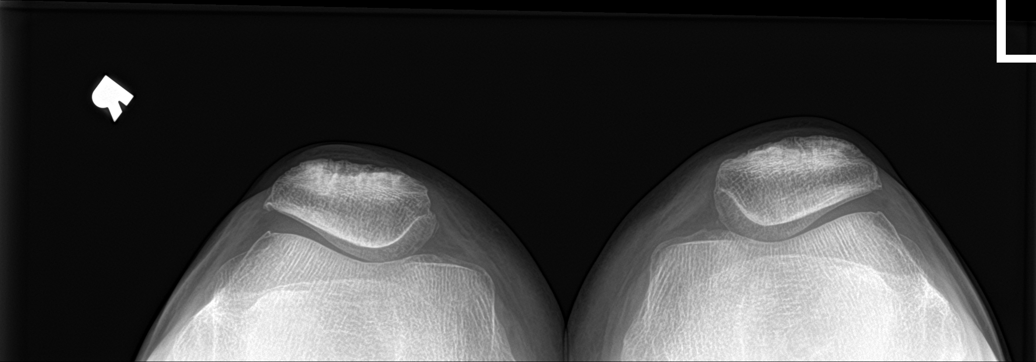

[knee ap bilat standing (1 of 2)]
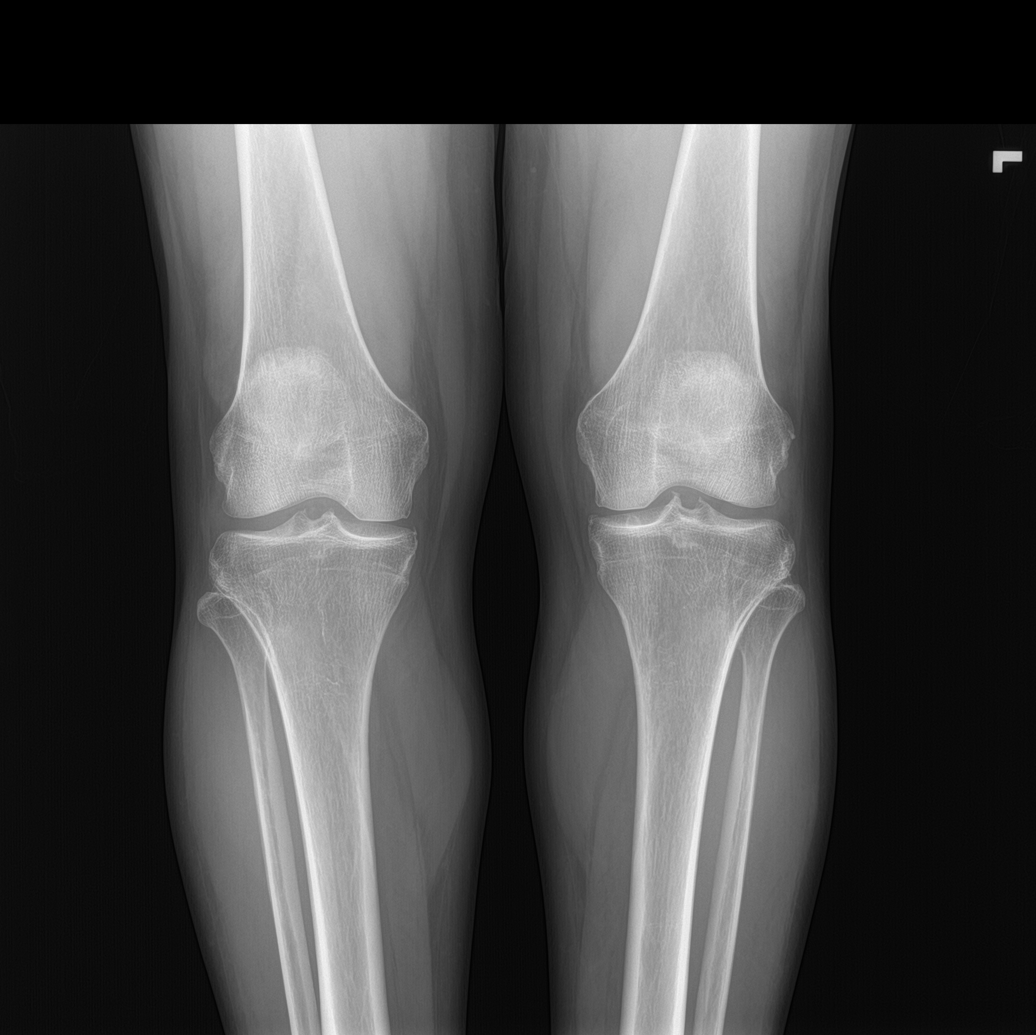

[knee ap bilat standing (2 of 2)]
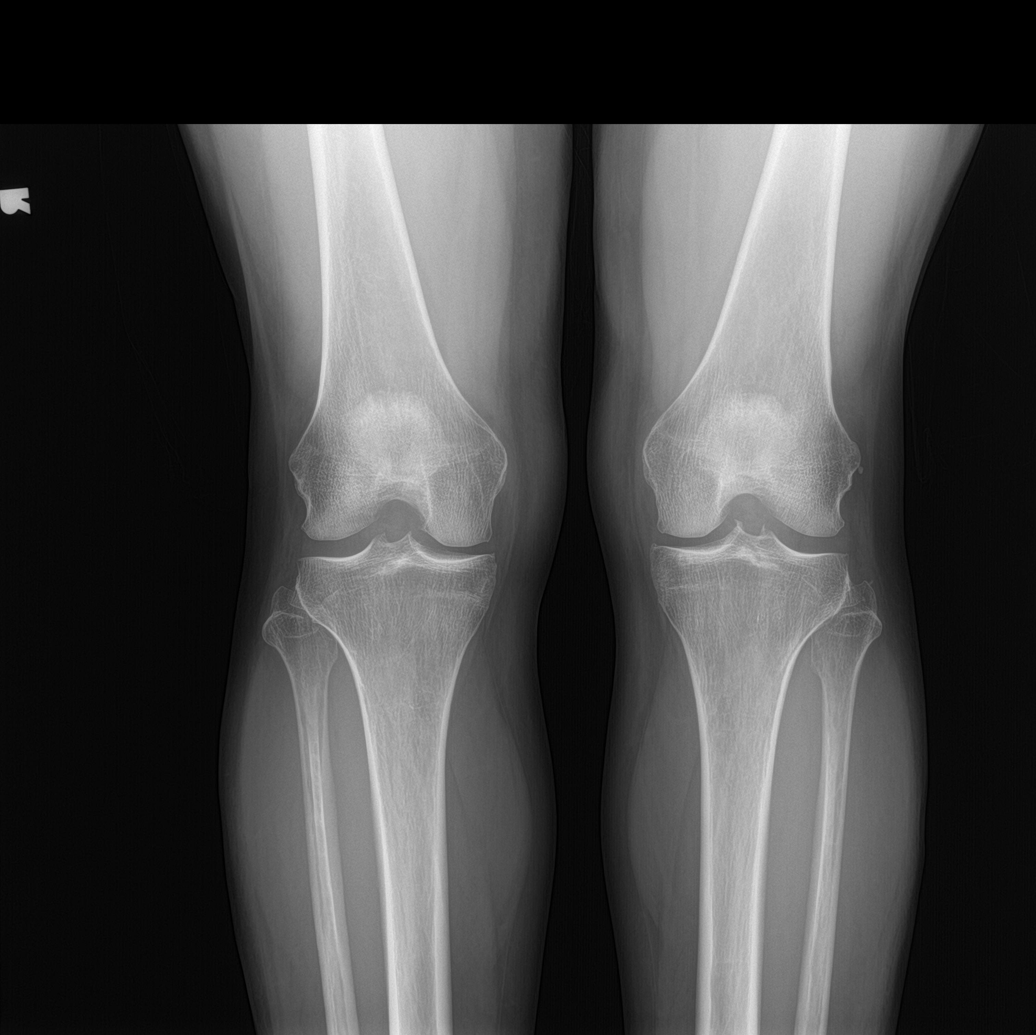

[knee lat (2 of 2)]
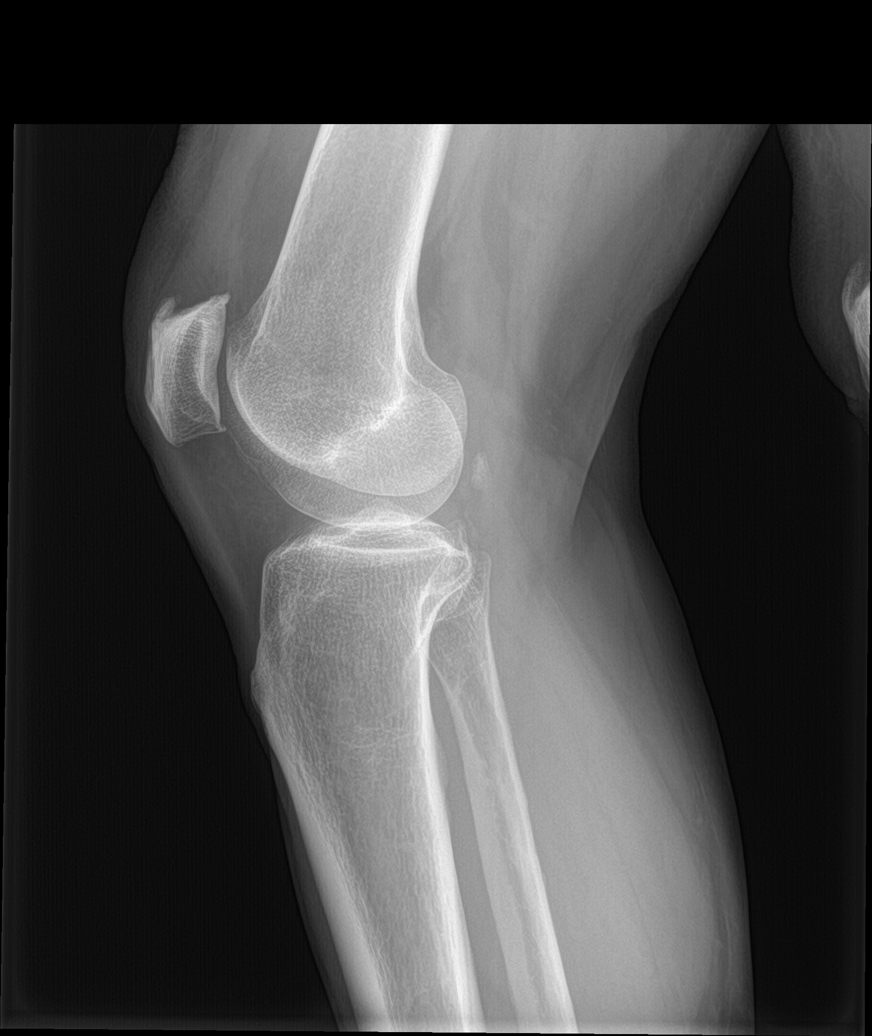

[5 of 5 positions shown; findings below may reference images not displayed]

FINDINGS: No fracture.  No bone lesion.

Mild narrowing of the medial joint space compartment with small
marginal osteophytes from the medial compartment and patella. No
other arthropathic change.

No joint effusion.

Soft tissues are unremarkable.
IMPRESSION: 1. No fracture, bone lesion or acute finding.
2. Mild osteoarthritis.

## 2019-08-10 ENCOUNTER — Ambulatory Visit: Payer: Medicare Other

## 2021-06-02 ENCOUNTER — Other Ambulatory Visit: Payer: Self-pay

## 2021-06-02 ENCOUNTER — Emergency Department
Admission: EM | Admit: 2021-06-02 | Discharge: 2021-06-02 | Disposition: A | Discharge status: Home / Self Care | Payer: Medicare Other | Source: Home / Self Care

## 2021-06-02 DIAGNOSIS — J0100 Acute maxillary sinusitis, unspecified: Secondary | ICD-10-CM

## 2021-06-02 DIAGNOSIS — J3489 Other specified disorders of nose and nasal sinuses: Secondary | ICD-10-CM | POA: Diagnosis not present

## 2021-06-02 MED ORDER — PREDNISONE 20 MG PO TABS: ORAL_TABLET | ORAL | 0 refills | Status: DC

## 2021-06-02 MED ORDER — AZITHROMYCIN 250 MG PO TABS: 250.0000 mg | ORAL_TABLET | Freq: Every day | ORAL | 0 refills | Status: DC

## 2021-06-02 NOTE — ED Provider Notes (Signed)
[name]    CSN: [id number] Arrival date & time: 06/02/21  1016      History   Chief Complaint Chief Complaint  Patient presents with   Nasal Congestion   Facial Pain   Headache    HPI Darlene Strickland is a 75 y.o. female.   HPI 75 year old female presents with nasal congestion, facial pain, HA x1 week.  Past Medical History:  Diagnosis Date   Hypertension     Patient Active Problem List   Diagnosis Date Noted   Osteoarthritis of hand, primary localized, left 06/09/2016   Primary osteoarthritis of both knees 06/09/2016   Essential hypertension 08/16/2008    Past Surgical History:  Procedure Laterality Date   ABDOMINAL HYSTERECTOMY     BREAST SURGERY     LT cyst removal age 38   TONSILLECTOMY     URETHRAL CYST REMOVAL      OB History   No obstetric history on file.      Home Medications    Prior to Admission medications   Medication Sig Start Date End Date Taking? Authorizing Provider  azithromycin (ZITHROMAX) 250 MG tablet Take 1 tablet (250 mg total) by mouth daily. Take first 2 tablets together, then 1 every day until finished. 06/02/21  Yes Eliezer Lofts, FNP  predniSONE (DELTASONE) 20 MG tablet Take 3 tabs PO daily x 5 days. 06/02/21  Yes Eliezer Lofts, FNP  Calcium Carbonate-Vitamin D (CALCIUM + D PO) Take by mouth.    [provider]  diclofenac sodium (VOLTAREN) 1 % GEL Apply 1 application topically 3 (three) times daily. Apply 1 to 2 gm each application Patient not taking: Reported on 06/02/2021 06/04/16   Kandra Nicolas, MD  fish oil-omega-3 fatty acids 1000 MG capsule Take 2 g by mouth daily.    [provider]  meloxicam (MOBIC) 15 MG tablet One tab PO qAM with breakfast for 2 weeks, then daily prn pain. Patient not taking: Reported on 06/02/2021 06/09/16   Silverio Decamp, MD  MULTIPLE VITAMIN PO Take by mouth.    [provider]    Family History Family History  Problem Relation Age of  Onset   Breast cancer Sister    Colon cancer Sister     Social History Social History   Tobacco Use   Smoking status: Never   Smokeless tobacco: Never  Substance Use Topics   Alcohol use: Yes   Drug use: No     Allergies   Acetaminophen-codeine, Darvon [propoxyphene], Propoxyphene n-acetaminophen, and Shellfish allergy   Review of Systems Review of Systems  HENT:  Positive for congestion and sinus pressure.   Neurological:  Positive for headaches.  All other systems reviewed and are negative.   Physical Exam Triage Vital Signs ED Triage Vitals  Enc Vitals Group     BP 06/02/21 1102 (!) 146/87     Pulse Rate 06/02/21 1102 81     Resp 06/02/21 1102 14     Temp 06/02/21 1102 98.3 F (36.8 C)     Temp Source 06/02/21 1102 Oral     SpO2 06/02/21 1102 100 %     Weight --      Height --      Head Circumference --      Peak Flow --      Pain Score 06/02/21 1103 1     Pain Loc --      Pain Edu? --      Excl. in [place]? --  No data found.  Updated Vital Signs BP (!) 146/87 (BP Location: Left Arm)    Pulse 81    Temp 98.3 F (36.8 C) (Oral)    Resp 14    SpO2 100%      Physical Exam Vitals and nursing note reviewed.  Constitutional:      General: She is not in acute distress.    Appearance: Normal appearance. She is normal weight. She is not ill-appearing.  HENT:     Head: Normocephalic and atraumatic.     Right Ear: Tympanic membrane, ear canal and external ear normal.     Left Ear: Tympanic membrane, ear canal and external ear normal.     Mouth/Throat:     Mouth: Mucous membranes are moist.     Pharynx: Oropharynx is clear.  Eyes:     Extraocular Movements: Extraocular movements intact.     Conjunctiva/sclera: Conjunctivae normal.     Pupils: Pupils are equal, round, and reactive to light.  Cardiovascular:     Rate and Rhythm: Normal rate and regular rhythm.     Pulses: Normal pulses.     Heart sounds: Normal heart sounds.  Pulmonary:     Effort:  Pulmonary effort is normal.     Breath sounds: Normal breath sounds.  Musculoskeletal:     Cervical back: Normal range of motion and neck supple.  Skin:    General: Skin is warm and dry.  Neurological:     General: No focal deficit present.     Mental Status: She is alert and oriented to person, place, and time.     UC Treatments / Results  Labs (all labs ordered are listed, but only abnormal results are displayed) Labs Reviewed - No data to display  EKG   Radiology No results found.  Procedures Procedures (including critical care time)  Medications Ordered in UC Medications - No data to display  Initial Impression / Assessment and Plan / UC Course  I have reviewed the triage vital signs and the nursing notes.  Pertinent labs & imaging results that were available during my care of the patient were reviewed by me and considered in my medical decision making (see chart for details).     MDM: 1.  Acute maxillary sinusitis, recurrence not specified-Rx'd Zithromax; 2.  Sinus pressure-Rx'd Prednisone. Advised patient to take medication as directed with food to completion.  Advised patient may take Prednisone with Zithromax for the next 5 days.  Encouraged patient increase daily water intake while taking these medications.  Final Clinical Impressions(s) / UC Diagnoses   Final diagnoses:  Acute maxillary sinusitis, recurrence not specified  Sinus pressure     Discharge Instructions      Advised patient to take medication as directed with food to completion.  Advised patient may take Prednisone with Zithromax for the next 5 days.  Encouraged patient increase daily water intake while taking these medications.     ED Prescriptions     Medication Sig Dispense Auth. Provider   azithromycin (ZITHROMAX) 250 MG tablet Take 1 tablet (250 mg total) by mouth daily. Take first 2 tablets together, then 1 every day until finished. 6 tablet Eliezer Lofts, FNP   predniSONE  (DELTASONE) 20 MG tablet Take 3 tabs PO daily x 5 days. 15 tablet Eliezer Lofts, FNP      PDMP not reviewed this encounter.   Eliezer Lofts, [place] 06/02/21 1231

## 2021-06-02 NOTE — Discharge Instructions (Addendum)
Advised patient to take medication as directed with food to completion.  Advised patient may take Prednisone with Zithromax for the next 5 days.  Encouraged patient increase daily water intake while taking these medications.

## 2021-06-02 NOTE — ED Triage Notes (Signed)
Pt presents with concern for sinus infection. Pt states she has had congestion, facial pain and HA x 1 week.

## 2022-07-28 ENCOUNTER — Ambulatory Visit
Admission: EM | Admit: 2022-07-28 | Discharge: 2022-07-28 | Disposition: A | Discharge status: Home / Self Care | Payer: Medicare Other | Attending: Urgent Care | Admitting: Urgent Care

## 2022-07-28 DIAGNOSIS — J0101 Acute recurrent maxillary sinusitis: Secondary | ICD-10-CM

## 2022-07-28 MED ORDER — AMOXICILLIN-POT CLAVULANATE 875-125 MG PO TABS: 1.0000 | ORAL_TABLET | Freq: Two times a day (BID) | ORAL | 0 refills | Status: AC

## 2022-07-28 MED ORDER — FLUTICASONE PROPIONATE 50 MCG/ACT NA SUSP: 1.0000 | Freq: Every day | NASAL | 0 refills | Status: AC

## 2022-07-28 NOTE — Discharge Instructions (Signed)
You have a sinus infection.   Please start taking the antibiotic, Augmentin, twice daily with food. Take it for all 10 days, do not stop early just because you feel better. Take an over the counter probiotic or yogurt daily to help prevent diarrhea/ yeast infection.  Use Flonase daily to help with inflammation of the nasal passage. It is also recommended that you use nasal saline/ sinus washes to cleans the sinus passages.  Hot steam from a shower or vaporizer may also be beneficial to help open up the upper airway. Eucalyptus can be helpful.  Please stop your sudafed and your afrin.  If any worsening symptoms such as headache, fever, or shortness of breath, please return for recheck.

## 2022-07-28 NOTE — ED Provider Notes (Signed)
[name]    CSN: [id number] Arrival date & time: 07/28/22  1257      History   Chief Complaint Chief Complaint  Patient presents with   Nasal Congestion    HPI Velicia Walker is a 77 y.o. female.   Pleasant 77 year old female presents today due to concerns of her annual sinusitis.  She states she gets this once a year.  Last noted infection was in December 2022.  She states over the past week she has had increased pain and pressure primarily to her maxillary sinuses, with a copious amount of purulent postnasal drainage. She reports pressure in her face and pain in her teeth with chewing.  She denies a known fever.  She has been taking over-the-counter Sudafed and Afrin without any relief.  She states this feels similar to her prior infections. Denies lower respiratory tract symptoms.     Past Medical History:  Diagnosis Date   Hypertension     Patient Active Problem List   Diagnosis Date Noted   Osteoarthritis of hand, primary localized, left 06/09/2016   Primary osteoarthritis of both knees 06/09/2016   Essential hypertension 08/16/2008    Past Surgical History:  Procedure Laterality Date   ABDOMINAL HYSTERECTOMY     BREAST SURGERY     LT cyst removal age 26   TONSILLECTOMY     URETHRAL CYST REMOVAL      OB History   No obstetric history on file.      Home Medications    Prior to Admission medications   Medication Sig Start Date End Date Taking? Authorizing Provider  amoxicillin-clavulanate (AUGMENTIN) 875-125 MG tablet Take 1 tablet by mouth every 12 (twelve) hours for 10 days. 07/28/22 08/07/22 Yes Shanina Kepple L, PA  fluticasone (FLONASE) 50 MCG/ACT nasal spray Place 1 spray into both nostrils daily. 07/28/22  Yes Jehan Ranganathan L, PA  Calcium Carbonate-Vitamin D (CALCIUM + D PO) Take by mouth.    [provider]  fish oil-omega-3 fatty acids 1000 MG capsule Take 2 g by mouth daily.    [provider]  MULTIPLE VITAMIN PO  Take by mouth.    [provider]    Family History Family History  Problem Relation Age of Onset   Breast cancer Sister    Colon cancer Sister     Social History Social History   Tobacco Use   Smoking status: Never   Smokeless tobacco: Never  Substance Use Topics   Alcohol use: Yes   Drug use: No     Allergies   Acetaminophen-codeine, Darvon [propoxyphene], Propoxyphene n-acetaminophen, and Shellfish allergy   Review of Systems Review of Systems As per HPI  Physical Exam Triage Vital Signs ED Triage Vitals  Enc Vitals Group     BP 07/28/22 1307 (!) 145/82     Pulse Rate 07/28/22 1307 85     Resp 07/28/22 1307 17     Temp 07/28/22 1307 98 F (36.7 C)     Temp Source 07/28/22 1307 Oral     SpO2 07/28/22 1307 100 %     Weight --      Height --      Head Circumference --      Peak Flow --      Pain Score 07/28/22 1309 3     Pain Loc --      Pain Edu? --      Excl. in [place]? --    No data found.  Updated Vital Signs  BP (!) 145/82 (BP Location: Right Arm)   Pulse 85   Temp 98 F (36.7 C) (Oral)   Resp 17   SpO2 100%   Visual Acuity Right Eye Distance:   Left Eye Distance:   Bilateral Distance:    Right Eye Near:   Left Eye Near:    Bilateral Near:     Physical Exam Vitals and nursing note reviewed.  Constitutional:      General: She is not in acute distress.    Appearance: Normal appearance. She is normal weight. She is not ill-appearing, toxic-appearing or diaphoretic.  HENT:     Head: Normocephalic and atraumatic.     Right Ear: Tympanic membrane, ear canal and external ear normal. There is no impacted cerumen.     Left Ear: Tympanic membrane, ear canal and external ear normal. There is no impacted cerumen.     Nose: Congestion and rhinorrhea present.     Right Turbinates: Not enlarged.     Left Turbinates: Not enlarged.     Right Sinus: Maxillary sinus tenderness and frontal sinus tenderness present.     Left Sinus: Maxillary sinus  tenderness and frontal sinus tenderness present.     Mouth/Throat:     Mouth: Mucous membranes are moist.     Pharynx: Oropharynx is clear. No oropharyngeal exudate or posterior oropharyngeal erythema.  Eyes:     General: No scleral icterus.       Right eye: No discharge.        Left eye: No discharge.     Extraocular Movements: Extraocular movements intact.     Conjunctiva/sclera: Conjunctivae normal.     Pupils: Pupils are equal, round, and reactive to light.  Cardiovascular:     Rate and Rhythm: Normal rate and regular rhythm.     Pulses: Normal pulses.     Heart sounds: No murmur heard. Pulmonary:     Effort: Pulmonary effort is normal. No respiratory distress.     Breath sounds: Normal breath sounds. No stridor. No wheezing, rhonchi or rales.  Chest:     Chest wall: No tenderness.  Musculoskeletal:     Cervical back: Normal range of motion and neck supple. No rigidity or tenderness.  Lymphadenopathy:     Cervical: No cervical adenopathy.  Neurological:     Mental Status: She is alert.      UC Treatments / Results  Labs (all labs ordered are listed, but only abnormal results are displayed) Labs Reviewed - No data to display  EKG   Radiology No results found.  Procedures Procedures (including critical care time)  Medications Ordered in UC Medications - No data to display  Initial Impression / Assessment and Plan / UC Course  I have reviewed the triage vital signs and the nursing notes.  Pertinent labs & imaging results that were available during my care of the patient were reviewed by me and considered in my medical decision making (see chart for details).     Acute recurrent maxillary sinusitis - sx consistent with prior infections. Recommended pt stop sudafed and afrin, will switch to flonase. Add augmentin BID x 10 days. Additional supportive measures reviewed.  Final Clinical Impressions(s) / UC Diagnoses   Final diagnoses:  Acute recurrent maxillary  sinusitis     Discharge Instructions      You have a sinus infection.   Please start taking the antibiotic, Augmentin, twice daily with food. Take it for all 10 days, do not stop early just because you  feel better. Take an over the counter probiotic or yogurt daily to help prevent diarrhea/ yeast infection.  Use Flonase daily to help with inflammation of the nasal passage. It is also recommended that you use nasal saline/ sinus washes to cleans the sinus passages.  Hot steam from a shower or vaporizer may also be beneficial to help open up the upper airway. Eucalyptus can be helpful.  Please stop your sudafed and your afrin.  If any worsening symptoms such as headache, fever, or shortness of breath, please return for recheck.     ED Prescriptions     Medication Sig Dispense Auth. Provider   fluticasone (FLONASE) 50 MCG/ACT nasal spray Place 1 spray into both nostrils daily. 16 mL Teagan Ozawa L, PA   amoxicillin-clavulanate (AUGMENTIN) 875-125 MG tablet Take 1 tablet by mouth every 12 (twelve) hours for 10 days. 20 tablet Kelsea Mousel L, [place]      PDMP not reviewed this encounter.   Chaney Malling, [place] 07/28/22 1411

## 2022-07-28 NOTE — ED Triage Notes (Signed)
Pt c/o nasal congestion since Friday. Lots of post nasal drainage and pressure in face. Afrin and OTC decongestant med prn.
